# Patient Record
Sex: Female | Born: 1970 | Race: White | Hispanic: Yes | Marital: Single | Smoking: Never smoker
Health system: Southern US, Community
[De-identification: ages and names within clinical notes are randomized; demographics above are authoritative.]

## PROBLEM LIST (undated history)

## (undated) DIAGNOSIS — J45909 Unspecified asthma, uncomplicated: Secondary | ICD-10-CM

## (undated) DIAGNOSIS — N809 Endometriosis, unspecified: Secondary | ICD-10-CM

## (undated) HISTORY — DX: Unspecified asthma, uncomplicated: J45.909

## (undated) HISTORY — PX: HYSTEROSCOPY: SHX211

## (undated) HISTORY — PX: DILATION AND CURETTAGE OF UTERUS: SHX78

## (undated) HISTORY — PX: KNEE SURGERY: SHX244

## (undated) HISTORY — DX: Endometriosis, unspecified: N80.9

## (undated) HISTORY — PX: VAGINAL HYSTERECTOMY: SUR661

---

## 2006-07-20 ENCOUNTER — Other Ambulatory Visit: Admission: RE | Admit: 2006-07-20 | Discharge: 2006-07-20 | Payer: Self-pay | Admitting: Obstetrics and Gynecology

## 2006-09-20 ENCOUNTER — Encounter: Admission: RE | Admit: 2006-09-20 | Discharge: 2006-09-20 | Payer: Self-pay | Admitting: Obstetrics and Gynecology

## 2007-01-21 ENCOUNTER — Encounter: Payer: Self-pay | Admitting: Obstetrics and Gynecology

## 2007-01-21 ENCOUNTER — Ambulatory Visit (HOSPITAL_COMMUNITY): Admission: RE | Admit: 2007-01-21 | Discharge: 2007-01-22 | Payer: Self-pay | Admitting: Obstetrics and Gynecology

## 2007-05-07 ENCOUNTER — Emergency Department (HOSPITAL_COMMUNITY): Admission: EM | Admit: 2007-05-07 | Discharge: 2007-05-07 | Payer: Self-pay | Admitting: Emergency Medicine

## 2007-07-24 ENCOUNTER — Other Ambulatory Visit: Admission: RE | Admit: 2007-07-24 | Discharge: 2007-07-24 | Payer: Self-pay | Admitting: Obstetrics and Gynecology

## 2007-08-02 ENCOUNTER — Emergency Department (HOSPITAL_COMMUNITY): Admission: EM | Admit: 2007-08-02 | Discharge: 2007-08-02 | Payer: Self-pay | Admitting: Emergency Medicine

## 2008-01-16 ENCOUNTER — Ambulatory Visit (HOSPITAL_COMMUNITY): Admission: RE | Admit: 2008-01-16 | Discharge: 2008-01-16 | Payer: Self-pay | Admitting: Obstetrics and Gynecology

## 2009-05-19 ENCOUNTER — Encounter: Admission: RE | Admit: 2009-05-19 | Discharge: 2009-05-19 | Payer: Self-pay | Admitting: Internal Medicine

## 2009-07-15 ENCOUNTER — Ambulatory Visit: Payer: Self-pay | Admitting: Obstetrics and Gynecology

## 2009-07-15 ENCOUNTER — Other Ambulatory Visit: Admission: RE | Admit: 2009-07-15 | Discharge: 2009-07-15 | Payer: Self-pay | Admitting: Obstetrics and Gynecology

## 2009-07-20 ENCOUNTER — Ambulatory Visit (HOSPITAL_COMMUNITY): Admission: RE | Admit: 2009-07-20 | Discharge: 2009-07-20 | Payer: Self-pay | Admitting: Obstetrics and Gynecology

## 2009-08-16 DIAGNOSIS — R599 Enlarged lymph nodes, unspecified: Secondary | ICD-10-CM | POA: Insufficient documentation

## 2009-09-15 ENCOUNTER — Encounter (INDEPENDENT_AMBULATORY_CARE_PROVIDER_SITE_OTHER): Payer: Self-pay | Admitting: *Deleted

## 2009-09-15 DIAGNOSIS — R5381 Other malaise: Secondary | ICD-10-CM

## 2009-09-15 DIAGNOSIS — Z90710 Acquired absence of both cervix and uterus: Secondary | ICD-10-CM | POA: Insufficient documentation

## 2009-09-15 DIAGNOSIS — Z87898 Personal history of other specified conditions: Secondary | ICD-10-CM | POA: Insufficient documentation

## 2009-09-15 DIAGNOSIS — J45909 Unspecified asthma, uncomplicated: Secondary | ICD-10-CM | POA: Insufficient documentation

## 2009-09-15 DIAGNOSIS — R5383 Other fatigue: Secondary | ICD-10-CM

## 2009-09-15 DIAGNOSIS — M542 Cervicalgia: Secondary | ICD-10-CM | POA: Insufficient documentation

## 2009-09-27 ENCOUNTER — Ambulatory Visit: Payer: Self-pay | Admitting: Infectious Diseases

## 2009-09-27 LAB — CONVERTED CEMR LAB

## 2009-09-28 ENCOUNTER — Ambulatory Visit (HOSPITAL_COMMUNITY): Admission: RE | Admit: 2009-09-28 | Discharge: 2009-09-28 | Payer: Self-pay | Admitting: Infectious Diseases

## 2009-10-05 ENCOUNTER — Telehealth: Payer: Self-pay | Admitting: Infectious Diseases

## 2010-04-06 ENCOUNTER — Encounter: Admission: RE | Admit: 2010-04-06 | Discharge: 2010-04-06 | Payer: Self-pay | Admitting: Otolaryngology

## 2010-06-07 NOTE — Miscellaneous (Signed)
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

## 2010-06-07 NOTE — Miscellaneous (Signed)
Summary: HIPAA Restrictions  HIPAA Restrictions   Imported By: Florinda Marker 09/28/2009 14:12:58  _____________________________________________________________________  External Attachment:    Type:   Image     Comment:   External Document

## 2010-06-07 NOTE — Progress Notes (Signed)
Summary: needs ent referral  Phone Note Outgoing Call   Summary of Call: spoke with pt and discussed the mild LAN on her CT scan.  She is still having pain. I told her we shoudl refer to ENT for bxp and evaluation to r/o unusual infection or malignancy Initial call taken by: Clydie Braun MD,  Oct 05, 2009 9:18 AM

## 2010-06-07 NOTE — Assessment & Plan Note (Signed)
Summary: new pt/lymphadenopathy/kdw   CC:  new patient / lymphadenopathy.  History of Present Illness: 40 yo hispanic female here with lump in neck. Began Nov 201 with tingling in spine and ticklish feeling.  Then pain began.  Went to [facility] and got a shot of ?abx.  Saw Dr Thea Silversmith in Jan 2011.  At that time she was having constant pain not mproving at all.  Had referral to Neuro Dr Richardean Chimera and had extensive work up including 2 mris.  No obvisou etiolog. Pain is constant, but varies by day.  Feels like needles in neck.  Goes down L arm at times. No cough, no dental issues.  No sinus issues.  No ear infection.  She has had constant pain and occas swelling in the region. No fevers chils, NS.  No wt loss (has gained about 12 pounds)  Was on FMLA for work at [employer] for a month  No travel outside country. no pets.  No history of TB exposure.  Has had TB test done in past andthey have been negative  Preventive Screening-Counseling & Management  Alcohol-Tobacco     Alcohol drinks/day: 0     Smoking Status: never  Caffeine-Diet-Exercise     Caffeine use/day: coffee     Does Patient Exercise: no  Safety-Violence-Falls     Seat Belt Use: yes   Current Allergies (reviewed today): ! IBUPROFEN Past History:  Family History: Last updated: 09/27/2009 no fh LAN or lymphoma throat cancer in mat gf aunt with ovarian.  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 From [place] - only in [place] for 4 yrs. No travel Lived for 10 years in [place] [place]    Risk Factors: Alcohol Use: 0 (09/27/2009) Caffeine Use: coffee (09/27/2009) Exercise: no (09/27/2009)  Risk Factors: Smoking Status: never (09/27/2009)  Past Medical History: uterine cancer - Saw Dr Kerrie Pleasure this march and no evidence recurrent cancer.  astham migraines  Family History: no fh LAN or lymphoma throat cancer in mat  gf aunt with ovarian.  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 From [place] - only in [place] for 4 yrs. No travel Lived for 10 years in [place] [place]    Review of Systems       11 systems reviewed and negative except per HPI   Vital Signs:  Patient profile:   40 year old female Height:      62 inches (157.48 cm) Weight:      162.4 pounds (73.82 kg) BMI:     29.81 Temp:     98.0 degrees F (36.67 degrees C) oral Pulse rate:   69 / minute BP sitting:   133 / 88  (right arm)  Vitals Entered By: Baxter Hire) (Sep 27, 2009 3:05 PM) CC: new patient / lymphadenopathy Pain Assessment Patient in pain? yes     Location: neck Intensity: 4 Type: aching Onset of pain  Constant Nutritional Status BMI of 25 - 29 = overweight Nutritional Status Detail appetite is normal per patient  Does patient need assistance? Functional Status Self care Ambulation Normal   Physical Exam  General:  alert and well-developed.   Head:  normocephalic.   Eyes:  vision grossly intact, pupils equal, pupils round, and pupils reactive to light.   Ears:  R ear normal and L ear normal.   Mouth:  fair dentition.   Neck:  somewhat limited side to side movement due to pain.  + tenderness over L trapezius. some shotty L ant cerv lan and ? L [place] small node Lungs:  clear Heart:  normal rate and regular rhythm.   Abdomen:  soft, non-tender, and normal bowel sounds.   Msk:  normal ROM.   Extremities:  no cce Neurologic:  alert & oriented X3 and cranial nerves II-XII intact.   Skin:  acne on face but ow clear Cervical Nodes:  shotty L ant cerv and L [place] node?? Psych:  Oriented X3 and memory intact for recent and remote.     Impression & Recommendations:  Problem # 1:  LYMPHADENOPATHY (ICD-785.6) 40 yo with 6 months of L neck pain and ? mild LAN.  No fevers or wt loss.   Has had extensive w/u by Neuroology without etiology for  her pain.    Difff dx - cat scratch disease, tb, lymphoma, toxo, kikuchi. WIll check PPD, HIV, RPR and CT neck.   Will base further work up on CT neck findings.  May need serological work up but not sure this is infecitous. Consider referral to ENT for  with 1/2sample sent to path and half for routine afb and fungal cx.   Orders: Consultation Level IV (04540) T-HIV Antibody  (Reflex) [phone]) T-RPR (Syphilis) [phone]) TB Skin Test [phone]) CT with & without Contrast (CT w&w/o Contrast)  Problem # 2:  FATIGUE (ICD-780.79) see above Orders: Consultation Level IV (62952) TB Skin Test (84132) CT with & without Contrast (CT w&w/o Contrast)  Medications Added to Medication List This Visit: 1)  Singulair 10 Mg Tabs (Montelukast sodium) .... Take as needed. 2)  Hydroxyzine Hcl 25 Mg Tabs (Hydroxyzine hcl) .... Take one tablet by mouth once a day. 3)  Methocarbamol 500 Mg Tabs (Methocarbamol) .... Take one tablet by mouth at night.  Other Orders: Admin 1st Vaccine (44010)  Patient Instructions: 1)  Get CT of neck and TB Test.  Return Wed or Thurs to have TB test read.   2)  Call by friday for results.   Immunizations Administered:  PPD Skin Test:    Vaccine Type: PPD    Site: left forearm    Mfr: Sanofi Pasteur    Dose: 0.1 ml    Route: ID    Given by: Wendall Mola CMA ( AAMA)    Exp. Date: 09/10/2011    Lot #: [id number]  PPD Results    Date of reading: 09/29/2009    Results: < 5mm    Interpretation: negative

## 2010-08-04 ENCOUNTER — Emergency Department (HOSPITAL_COMMUNITY): Payer: BC Managed Care – PPO

## 2010-08-04 ENCOUNTER — Emergency Department (HOSPITAL_COMMUNITY)
Admission: EM | Admit: 2010-08-04 | Discharge: 2010-08-04 | Disposition: A | Discharge status: Home / Self Care | Payer: BC Managed Care – PPO | Attending: Emergency Medicine | Admitting: Emergency Medicine

## 2010-08-04 DIAGNOSIS — M542 Cervicalgia: Secondary | ICD-10-CM | POA: Insufficient documentation

## 2010-08-04 DIAGNOSIS — M25519 Pain in unspecified shoulder: Secondary | ICD-10-CM | POA: Insufficient documentation

## 2010-08-04 DIAGNOSIS — R51 Headache: Secondary | ICD-10-CM | POA: Insufficient documentation

## 2010-08-04 DIAGNOSIS — IMO0002 Reserved for concepts with insufficient information to code with codable children: Secondary | ICD-10-CM | POA: Insufficient documentation

## 2010-08-04 DIAGNOSIS — S139XXA Sprain of joints and ligaments of unspecified parts of neck, initial encounter: Secondary | ICD-10-CM | POA: Insufficient documentation

## 2010-08-04 DIAGNOSIS — J45909 Unspecified asthma, uncomplicated: Secondary | ICD-10-CM | POA: Insufficient documentation

## 2010-08-09 ENCOUNTER — Other Ambulatory Visit (HOSPITAL_COMMUNITY)
Admission: RE | Admit: 2010-08-09 | Discharge: 2010-08-09 | Disposition: A | Discharge status: Home / Self Care | Payer: BC Managed Care – PPO | Source: Ambulatory Visit | Attending: Obstetrics and Gynecology | Admitting: Obstetrics and Gynecology

## 2010-08-09 ENCOUNTER — Other Ambulatory Visit: Payer: Self-pay | Admitting: Obstetrics and Gynecology

## 2010-08-09 ENCOUNTER — Encounter (INDEPENDENT_AMBULATORY_CARE_PROVIDER_SITE_OTHER): Payer: BC Managed Care – PPO | Admitting: Obstetrics and Gynecology

## 2010-08-09 DIAGNOSIS — Z124 Encounter for screening for malignant neoplasm of cervix: Secondary | ICD-10-CM | POA: Insufficient documentation

## 2010-08-09 DIAGNOSIS — Z01419 Encounter for gynecological examination (general) (routine) without abnormal findings: Secondary | ICD-10-CM

## 2010-08-09 DIAGNOSIS — Z13220 Encounter for screening for lipoid disorders: Secondary | ICD-10-CM

## 2010-08-09 DIAGNOSIS — Z833 Family history of diabetes mellitus: Secondary | ICD-10-CM

## 2010-09-16 ENCOUNTER — Other Ambulatory Visit: Payer: Self-pay | Admitting: Obstetrics and Gynecology

## 2010-09-16 DIAGNOSIS — N644 Mastodynia: Secondary | ICD-10-CM

## 2010-09-20 NOTE — Op Note (Signed)
NAME:  Rachel Reyes, Rachel Reyes              ACCOUNT NO.:  [id number]   MEDICAL RECORD NO.:  [id number]          PATIENT TYPE:  AMB   LOCATION:  SDC                           [facility]   PHYSICIAN:  Daniel L. Gottsegen, M.D.DATE OF BIRTH:  [birth year]   DATE OF PROCEDURE:  01/21/2007  DATE OF DISCHARGE:                               OPERATIVE REPORT   PREOPERATIVE DIAGNOSIS:  1. Complex endometrial hyperplasia.  2. Pelvic pain.  3. Endometriosis suspected.   POSTOPERATIVE DIAGNOSIS:  1. Complex endometrial hyperplasia.  2. Pelvic pain.   OPERATION:  Laparoscopic-assisted vaginal hysterectomy.   SURGEON:  Daniel L. Eda Paschal, M.D.   FIRST ASSISTANT:  Juan H. Lily Peer, M.D.   FINDINGS AT SURGERY:  At the time of surgery the patient's uterus was  slightly boggy and enlarged.  Right ovary and fallopian tube were  normal.  Left ovary appeared to be slightly enlarged on ultrasound  within a month it had looked normal.  It was aspirated with a spinal  needle, and no fluid was obtained.  Pelvic peritoneum was free of any  disease.   DESCRIPTION OF PROCEDURE:  After adequate general orotracheal anesthesia  the patient was placed in the dorsal supine position, and prepped and  draped in the usual sterile manner.  A Foley catheter was inserted into  the patient's bladder.  A Hulka catheter was inserted in the patient's  uterus.  A subumbilical midline incision was made and using the Excel  and direct vision, a direct entry into the peritoneal cavity was done  atraumatically.  Two 5 mm ports were placed in the right and left lower  quadrant incisions.   The uterus was then elevated.  The round ligaments were bipolared and  cut.  The vesicouterine fold of peritoneum was sharply incised.  The  utero-ovarian and fallopian tubes were bipolared and cut in such a  fashion leaving the ovaries and tubes intact.  The uterine arteries were  bipolared and cut.   Attention was next turned  vaginally.  A 1:200,000 solution of  epinephrine was injected around the cervix and 1/2% Xylocaine.  A 360-  degree incision was made around the cervix.  The bladder was mobilized  superiorly as was the posterior peritoneum.  Posterior peritoneum and  vesicouterine fold of peritoneum were entered by sharp dissection.  The  uterosacral ligaments were clamped, in clamping them they were  shortened, and then they were sutured to the vault laterally for good  vault support.   Cardinal ligaments and the balance of the broad ligament were then  clamped, cut, and suture ligated as well.  A #1 chromic catgut was  utilized for all the above-mentioned pedicles.  The uterus was  delivered, sent to pathology for tissue diagnosis.  There was just a  suggestion that the left ovary might be enlarged.  It felt normal.  There was no evidence of any firm lesion there.  An aspiration was done,  and no real fluid was obtained; and, as noted above, the patient had a  normal ultrasound a month before.   At this point the cuff  was sutured to the peritoneum with two running #0  Vicryls.  A 3-0 Vicryl modified McCall's enterocele was placed to  prevent enterocele, and then the cuff and peritoneum were closed with  figure-of-eights and #1 chromic catgut.  Copious irrigation have been  done with sterile saline.  Two sponge, needle, and instrument counts  were correct.   The surgeons regloved, went back above, inspected the pelvis, and there  were several slight oozings that were controlled with bipolar and then  all cul-de-sac fluid was removed.  The trocars were removed.  The  subumbilical fascial incision was closed with #0 Vicryl and all three  skin incisions were closed with Dermabond.  Estimated blood loss for the  entire procedure was less than 100 mL with none replaced.  The patient  tolerated the procedure well and left the operating room in satisfactory  condition, draining clear urine from her Foley  catheter.      Daniel L. Eda Paschal, M.D.  Electronically Signed     DLG/MEDQ  D:  01/21/2007  T:  01/21/2007  Job:  [id number]

## 2010-09-20 NOTE — H&P (Signed)
NAME:  Rachel Reyes, Rachel Reyes              ACCOUNT NO.:  [id number]   MEDICAL RECORD NO.:  [id number]         PATIENT TYPE:  WOIB   LOCATION:                                [facility]   PHYSICIAN:  Daniel L. Eda Paschal, M.D.   DATE OF BIRTH:   DATE OF ADMISSION:  DATE OF DISCHARGE:                              HISTORY & PHYSICAL   It was marked priority because she is for the operating room this  morning.   CHIEF COMPLAINT:  Complex hyperplasia plus pelvic pain with  endometriosis strongly suspected.   HISTORY OF PRESENT ILLNESS:  The patient is a 40 year old nulligravida  who has been followed both in [place], [place] and now in  [place], with recurrent complex endometrial hyperplasia.  She has  been treated initially with norethindrone.  Norethindrone did reverse  the changes of the endometrial hyperplasia, however, the patient had  hair growth and weight gain with it, and elected to stop it.  She was  switched to oral contraceptives because of this, after a biopsy had  shown the complex hyperplasia which had recurred twice was not now  present.  The patient has not done well with pills.  She breaks through.  She finds them expensive on her health insurance in spite of using  different types, and she also continues to have pelvic pain.  She had  been told previously that she had endometriosis, although this was a  clinical diagnosis without laparoscopic confirmation.  The patient had  been previously told to have a hysterectomy because of the atypia of her  complex hyperplasia, but had elected not to do so because at that time  she wanted to preserve her uterus for childbearing.  Her initial  diagnosis had been made in 2004.  She now has decided that she does not  want to proceed with pregnancy, and would like to stop taking medication  to protect her lining against the endometrial hyperplasia.  She,  therefore, enters the hospital for laparoscopic-assisted vaginal  hysterectomy.  Because of her age, we have elected to save her ovaries;  however, if she has significant endometriosis, she has given me  permission to do what is appropriate based on the findings at time of  surgery.  The additional issue is that because she has never had any  children, I have explained to her that it may not be possible to do the  surgery laparoscopically or vaginally and, therefore, she may have to  proceed with an incision, but we will make that determination on the day  of surgery.   PAST MEDICAL HISTORY:  The patient has had 3 D&Cs/hysteroscopies, all of  which were for endometrial hyperplasia.  She gets migraines.  She also  has asthma.   CURRENT MEDICATIONS:  Oral contraceptives, hydroxyzine,  hydrochlorothiazide, Singulair and Imitrex.   She is ALLERGIC TO IBUPROFEN.   FAMILY HISTORY:  Grandmother is diabetic.  Maternal aunt had ovarian  cancer.   SOCIAL HISTORY:  She is a nonsmoker/nondrinker.   REVIEW OF SYSTEMS:  HEENT:  Negative except for her history of  migraines.  CARDIOVASCULAR:  Negative.  RESPIRATORY:  A history of  asthma.  GI:  Negative.  GU:  Negative.  NEUROMUSCULAR:  Negative.  ENDOCRINE:  Negative.   PHYSICAL EXAMINATION:  The patient is a well-developed and well-  nourished female in no acute distress.  Her blood pressure is 120/74.  Her pulse is 80 and regular.  Respirations are 16, nonlabored.  She is afebrile.  HEENT:  All within normal limits.  NECK:  Supple.  Trach in the midline.  Thyroid is not enlarged.  LUNGS:  Clear to P&A.  HEART:  No thrills, heaves or murmurs.  BREASTS:  No masses.  ABDOMEN:  Soft without guarding, rebound or masses.  PELVIC:  External is normal.  BUS is normal.  Vaginal is normal.  Cervix  is clean.  Pap smear shows no atypia.  Uterus is enlarged by exceedingly  small myoma, and she has top-normal size.  Adnexa fails to reveal  masses.  RECTOVAGINAL:  Confirmatory.  EXTREMITIES:  Within normal  limits.   ADMISSION IMPRESSION:  Recurrent endometrial hyperplasia with resistance  to medical treatment.  Pelvic pain with concern that she has  endometriosis.   PLAN:  Laparoscopic-assisted vaginal hysterectomy.      Daniel L. Eda Paschal, M.D.  Electronically Signed     DLG/MEDQ  D:  01/21/2007  T:  01/21/2007  Job:  [id number]

## 2010-09-22 ENCOUNTER — Ambulatory Visit
Admission: RE | Admit: 2010-09-22 | Discharge: 2010-09-22 | Disposition: A | Discharge status: Home / Self Care | Payer: BC Managed Care – PPO | Source: Ambulatory Visit | Attending: Obstetrics and Gynecology | Admitting: Obstetrics and Gynecology

## 2010-09-22 DIAGNOSIS — N644 Mastodynia: Secondary | ICD-10-CM

## 2010-09-23 NOTE — Discharge Summary (Signed)
NAME:  Rachel Reyes, PINEAU              ACCOUNT NO.:  [id number]   MEDICAL RECORD NO.:  [id number]          PATIENT TYPE:  OIB   LOCATION:  9311                          [facility]   PHYSICIAN:  Daniel L. Gottsegen, M.D.DATE OF BIRTH:  May 30, 1970   DATE OF ADMISSION:  01/21/2007  DATE OF DISCHARGE:  01/22/2007                               DISCHARGE SUMMARY   HISTORY OF PRESENT ILLNESS:  The patient is a 40 year old female who has  a long history of complex hyperplasia with atypia who has been treated  conservatively for years but finally had requested to have hysterectomy  for definitive treatment as she was tired of continuing with oral  progestins.  She has also been having pelvic pain and endometriosis was  suspected.  As a result of the above, she was taken to the operating  room on September 15 and a laparoscopic-assisted vaginal hysterectomy  was performed.  Postoperatively, she did well and was ready for  discharge on the next morning.  She was discharged on Tylox for pain  relief.  She was discharged with regular activity.  She will be seen in  our office for follow-up in 3 weeks.  Final pathology report revealed  simple hyperplasia without atypia.  This was in spite of continuing on  treatment and no evidence of endometriosis.   LABORATORY DATA:  Showed a normal CBC and urinalysis.   CONDITION ON DISCHARGE:  Improved.   DISCHARGE DIAGNOSIS:  Complex hyperplasia with atypia status post years  of medical treatment, pelvic pain.   OPERATION:  Laparoscopic-assisted vaginal hysterectomy.   CONDITION ON DISCHARGE:  Improved.      Daniel L. Eda Paschal, M.D.  Electronically Signed     DLG/MEDQ  D:  02/19/2007  T:  02/19/2007  Job:  [id number]

## 2011-01-30 LAB — POCT I-STAT, CHEM 8
BUN: 12
Calcium, Ion: 1.13
Chloride: 104
Creatinine, Ser: 0.7
Glucose, Bld: 98
HCT: 38
Hemoglobin: 12.9
Potassium: 3.9
Sodium: 138
TCO2: 26

## 2011-01-30 LAB — POCT CARDIAC MARKERS
CKMB, poc: <1 — ABNORMAL LOW
Myoglobin, poc: 31.8
Operator id: 294501
Troponin i, poc: <0.05

## 2011-02-17 LAB — CBC
MCHC: 35.3
MCV: 92.3
Platelets: 329
RBC: 4.06
RDW: 12.8

## 2011-02-17 LAB — URINALYSIS, ROUTINE W REFLEX MICROSCOPIC
Bilirubin Urine: NEGATIVE
Ketones, ur: NEGATIVE
Nitrite: NEGATIVE
Protein, ur: NEGATIVE
pH: 6.5

## 2011-02-17 LAB — HCG, SERUM, QUALITATIVE: Preg, Serum: NEGATIVE

## 2011-10-20 ENCOUNTER — Other Ambulatory Visit: Payer: Self-pay | Admitting: Neurology

## 2011-10-20 DIAGNOSIS — M542 Cervicalgia: Secondary | ICD-10-CM

## 2011-10-25 ENCOUNTER — Other Ambulatory Visit: Payer: BC Managed Care – PPO

## 2012-05-31 ENCOUNTER — Encounter: Payer: Self-pay | Admitting: Gynecology

## 2012-05-31 DIAGNOSIS — N809 Endometriosis, unspecified: Secondary | ICD-10-CM | POA: Insufficient documentation

## 2012-05-31 DIAGNOSIS — J45909 Unspecified asthma, uncomplicated: Secondary | ICD-10-CM | POA: Insufficient documentation

## 2012-05-31 DIAGNOSIS — N8500 Endometrial hyperplasia, unspecified: Secondary | ICD-10-CM | POA: Insufficient documentation

## 2012-06-10 ENCOUNTER — Other Ambulatory Visit: Payer: Self-pay | Admitting: *Deleted

## 2012-06-10 ENCOUNTER — Encounter: Payer: Self-pay | Admitting: Gynecology

## 2012-06-10 ENCOUNTER — Ambulatory Visit (INDEPENDENT_AMBULATORY_CARE_PROVIDER_SITE_OTHER): Payer: BC Managed Care – PPO | Admitting: Gynecology

## 2012-06-10 ENCOUNTER — Other Ambulatory Visit (HOSPITAL_COMMUNITY)
Admission: RE | Admit: 2012-06-10 | Discharge: 2012-06-10 | Disposition: A | Discharge status: Home / Self Care | Payer: BC Managed Care – PPO | Source: Ambulatory Visit | Attending: Gynecology | Admitting: Gynecology

## 2012-06-10 VITALS — BP 122/74 | Ht 62.0 in | Wt 170.0 lb

## 2012-06-10 DIAGNOSIS — N644 Mastodynia: Secondary | ICD-10-CM

## 2012-06-10 DIAGNOSIS — Z01419 Encounter for gynecological examination (general) (routine) without abnormal findings: Secondary | ICD-10-CM

## 2012-06-10 LAB — PROLACTIN: Prolactin: 13.3 ng/mL

## 2012-06-10 NOTE — Patient Instructions (Signed)
Follow up for ultrasound and mammogram as arranged by office. Follow up for annual gynecologic exam in one year.

## 2012-06-10 NOTE — Progress Notes (Signed)
Rachel Reyes Oct 12, 1970 [id number]        42 y.o.  G0P0 for annual exam.  Several issues noted below.  Past medical history,surgical history, medications, allergies, family history and social history were all reviewed and documented in the EPIC chart. ROS:  Was performed and pertinent positives and negatives are included in the history.  Exam: Kim assistant Filed Vitals:   06/10/12 1145  BP: 122/74  Height: 5\' 2"  (1.575 m)  Weight: 170 lb (77.111 kg)   General appearance  Normal Skin grossly normal Head/Neck normal with no cervical or supraclavicular adenopathy thyroid normal Lungs  clear Cardiac RR, without RMG Abdominal  soft, nontender, without masses, organomegaly or hernia Breasts  examined lying and sitting without masses, retractions, discharge or axillary adenopathy. Pelvic  Ext/BUS/vagina  normal    Adnexa  Without masses or tenderness    Anus and perineum  normal   Rectovaginal  normal sphincter tone without palpated masses or tenderness.    Assessment/Plan:  42 y.o. G0P0 female for annual exam.   1. Left breast mastalgia. Present for several months. History of this previously which seemed to get better but now returned. No galactorrhea or other symptoms.  Exam is normal. We'll check baseline prolactin, diagnostic mammography and ultrasound. Assuming negative strategies to include heat/nonsteroidal anti-inflammatories/short course low dose oral contraceptives/danazol/testosterone reviewed. 2. History LAVH for complex endometrial hyperplasia with atypia 09/2010. Overall doing well we'll continue to monitor. 3. Pap smear 2012. No history of abnormal Pap smears previously. Options to stop screening altogether versus less frequent screening reviewed. Pap of cuff done today as she is new to me. We'll readdress screening on an annual basis. 4. Health maintenance. No blood work other than prolactin done today as it is all done through her primary physician's office who she sees on  an annual basis. Follow up for studies per above otherwise annually    Dara Lords MD, 12:17 PM 06/10/2012

## 2012-06-10 NOTE — Addendum Note (Signed)
Addended by: Dayna Barker on: 06/10/2012 12:26 PM   Modules accepted: Orders

## 2012-06-11 ENCOUNTER — Ambulatory Visit
Admission: RE | Admit: 2012-06-11 | Discharge: 2012-06-11 | Disposition: A | Discharge status: Home / Self Care | Payer: BC Managed Care – PPO | Source: Ambulatory Visit | Attending: Gynecology | Admitting: Gynecology

## 2012-06-11 DIAGNOSIS — N644 Mastodynia: Secondary | ICD-10-CM

## 2012-06-11 LAB — URINALYSIS W MICROSCOPIC + REFLEX CULTURE
Bacteria, UA: NONE SEEN
Bilirubin Urine: NEGATIVE
Casts: NONE SEEN
Crystals: NONE SEEN
Hgb urine dipstick: NEGATIVE
Ketones, ur: NEGATIVE mg/dL
Specific Gravity, Urine: 1.011 (ref 1.005–1.030)
Urobilinogen, UA: 0.2 mg/dL (ref 0.0–1.0)

## 2012-06-12 ENCOUNTER — Other Ambulatory Visit: Payer: Self-pay | Admitting: Gynecology

## 2012-06-12 MED ORDER — FLUCONAZOLE 150 MG PO TABS: 150.0000 mg | ORAL_TABLET | Freq: Once | ORAL | Status: DC
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
## 2013-06-09 ENCOUNTER — Telehealth: Payer: Self-pay

## 2013-06-09 ENCOUNTER — Other Ambulatory Visit: Payer: Self-pay

## 2013-06-09 DIAGNOSIS — Z1231 Encounter for screening mammogram for malignant neoplasm of breast: Secondary | ICD-10-CM

## 2013-06-09 NOTE — Telephone Encounter (Signed)
Patient advised.

## 2013-06-09 NOTE — Telephone Encounter (Signed)
Patient called stating she needed to schedule her diagnostic mammogram.  I called her back to understand her need. She said Dr. Reece AgarG always scheduled her diagnostic because she has very dense breasts that make mammogram difficult to view and also she said high risk for cancer because she had hysterectomy for ut cancer.  She has CE with you 07/10/13. (Of note, you ordered diagnostic last year because she came in with some breast tenderness.)  Please advise.

## 2013-06-09 NOTE — Telephone Encounter (Signed)
I would recommend 3-D mammography

## 2013-07-10 ENCOUNTER — Ambulatory Visit
Admission: RE | Admit: 2013-07-10 | Discharge: 2013-07-10 | Disposition: A | Discharge status: Home / Self Care | Payer: BC Managed Care – PPO | Source: Ambulatory Visit

## 2013-07-10 ENCOUNTER — Encounter: Payer: Self-pay | Admitting: Gynecology

## 2013-07-10 ENCOUNTER — Ambulatory Visit (INDEPENDENT_AMBULATORY_CARE_PROVIDER_SITE_OTHER): Payer: BC Managed Care – PPO

## 2013-07-10 ENCOUNTER — Other Ambulatory Visit: Payer: Self-pay | Admitting: Gynecology

## 2013-07-10 ENCOUNTER — Ambulatory Visit (INDEPENDENT_AMBULATORY_CARE_PROVIDER_SITE_OTHER): Payer: BC Managed Care – PPO | Admitting: Gynecology

## 2013-07-10 VITALS — BP 120/68 | Ht 62.0 in | Wt 167.0 lb

## 2013-07-10 DIAGNOSIS — R1031 Right lower quadrant pain: Secondary | ICD-10-CM

## 2013-07-10 DIAGNOSIS — R109 Unspecified abdominal pain: Secondary | ICD-10-CM

## 2013-07-10 DIAGNOSIS — R102 Pelvic and perineal pain: Secondary | ICD-10-CM

## 2013-07-10 DIAGNOSIS — N949 Unspecified condition associated with female genital organs and menstrual cycle: Secondary | ICD-10-CM

## 2013-07-10 DIAGNOSIS — Z01419 Encounter for gynecological examination (general) (routine) without abnormal findings: Secondary | ICD-10-CM

## 2013-07-10 DIAGNOSIS — N831 Corpus luteum cyst of ovary, unspecified side: Secondary | ICD-10-CM

## 2013-07-10 DIAGNOSIS — N898 Other specified noninflammatory disorders of vagina: Secondary | ICD-10-CM

## 2013-07-10 DIAGNOSIS — N951 Menopausal and female climacteric states: Secondary | ICD-10-CM

## 2013-07-10 DIAGNOSIS — Z1231 Encounter for screening mammogram for malignant neoplasm of breast: Secondary | ICD-10-CM

## 2013-07-10 LAB — WET PREP FOR TRICH, YEAST, CLUE
Clue Cells Wet Prep HPF POC: NONE SEEN
Trich, Wet Prep: NONE SEEN
WBC, Wet Prep HPF POC: NONE SEEN

## 2013-07-10 MED ORDER — FLUCONAZOLE 150 MG PO TABS: 150.0000 mg | ORAL_TABLET | Freq: Once | ORAL | Status: AC

## 2013-07-10 NOTE — Patient Instructions (Signed)
Followup for ultrasound in 3 months to relook at the right ovarian cyst.  Office will contact you with your lab results.  If your abdominal pain continues then I recommend following up with a gastroenterologist to investigate if your intestines are not causing the pain.  You may obtain a copy of any labs that were done today by logging onto MyChart as outlined in the instructions provided with your AVS (after visit summary). The office will not call with normal lab results but certainly if there are any significant abnormalities then we will contact you.   Health Maintenance, Female A healthy lifestyle and preventative care can promote health and wellness.  Maintain regular health, dental, and eye exams.  Eat a healthy diet. Foods like vegetables, fruits, whole grains, low-fat dairy products, and lean protein foods contain the nutrients you need without too many calories. Decrease your intake of foods high in solid fats, added sugars, and salt. Get information about a proper diet from your caregiver, if necessary.  Regular physical exercise is one of the most important things you can do for your health. Most adults should get at least 150 minutes of moderate-intensity exercise (any activity that increases your heart rate and causes you to sweat) each week. In addition, most adults need muscle-strengthening exercises on 2 or more days a week.   Maintain a healthy weight. The body mass index (BMI) is a screening tool to identify possible weight problems. It provides an estimate of body fat based on height and weight. Your caregiver can help determine your BMI, and can help you achieve or maintain a healthy weight. For adults 20 years and older:  A BMI below 18.5 is considered underweight.  A BMI of 18.5 to 24.9 is normal.  A BMI of 25 to 29.9 is considered overweight.  A BMI of 30 and above is considered obese.  Maintain normal blood lipids and cholesterol by exercising and minimizing your  intake of saturated fat. Eat a balanced diet with plenty of fruits and vegetables. Blood tests for lipids and cholesterol should begin at age 26 and be repeated every 5 years. If your lipid or cholesterol levels are high, you are over 50, or you are a high risk for heart disease, you may need your cholesterol levels checked more frequently.Ongoing high lipid and cholesterol levels should be treated with medicines if diet and exercise are not effective.  If you smoke, find out from your caregiver how to quit. If you do not use tobacco, do not start.  Lung cancer screening is recommended for adults aged 62 80 years who are at high risk for developing lung cancer because of a history of smoking. Yearly low-dose computed tomography (CT) is recommended for people who have at least a 30-pack-year history of smoking and are a current smoker or have quit within the past 15 years. A pack year of smoking is smoking an average of 1 pack of cigarettes a day for 1 year (for example: 1 pack a day for 30 years or 2 packs a day for 15 years). Yearly screening should continue until the smoker has stopped smoking for at least 15 years. Yearly screening should also be stopped for people who develop a health problem that would prevent them from having lung cancer treatment.  If you are pregnant, do not drink alcohol. If you are breastfeeding, be very cautious about drinking alcohol. If you are not pregnant and choose to drink alcohol, do not exceed 1 drink per day. One  drink is considered to be 12 ounces (355 mL) of beer, 5 ounces (148 mL) of wine, or 1.5 ounces (44 mL) of liquor.  Avoid use of street drugs. Do not share needles with anyone. Ask for help if you need support or instructions about stopping the use of drugs.  High blood pressure causes heart disease and increases the risk of stroke. Blood pressure should be checked at least every 1 to 2 years. Ongoing high blood pressure should be treated with medicines, if  weight loss and exercise are not effective.  If you are 55 to 45 years old, ask your caregiver if you should take aspirin to prevent strokes.  Diabetes screening involves taking a blood sample to check your fasting blood sugar level. This should be done once every 3 years, after age 45, if you are within normal weight and without risk factors for diabetes. Testing should be considered at a younger age or be carried out more frequently if you are overweight and have at least 1 risk factor for diabetes.  Breast cancer screening is essential preventative care for women. You should practice "breast self-awareness." This means understanding the normal appearance and feel of your breasts and may include breast self-examination. Any changes detected, no matter how small, should be reported to a caregiver. Women in their 20s and 30s should have a clinical breast exam (CBE) by a caregiver as part of a regular health exam every 1 to 3 years. After age 40, women should have a CBE every year. Starting at age 40, women should consider having a mammogram (breast X-ray) every year. Women who have a family history of breast cancer should talk to their caregiver about genetic screening. Women at a high risk of breast cancer should talk to their caregiver about having an MRI and a mammogram every year.  Breast cancer gene (BRCA)-related cancer risk assessment is recommended for women who have family members with BRCA-related cancers. BRCA-related cancers include breast, ovarian, tubal, and peritoneal cancers. Having family members with these cancers may be associated with an increased risk for harmful changes (mutations) in the breast cancer genes BRCA1 and BRCA2. Results of the assessment will determine the need for genetic counseling and BRCA1 and BRCA2 testing.  The Pap test is a screening test for cervical cancer. Women should have a Pap test starting at age 21. Between ages 21 and 29, Pap tests should be repeated every  2 years. Beginning at age 30, you should have a Pap test every 3 years as long as the past 3 Pap tests have been normal. If you had a hysterectomy for a problem that was not cancer or a condition that could lead to cancer, then you no longer need Pap tests. If you are between ages 65 and 70, and you have had normal Pap tests going back 10 years, you no longer need Pap tests. If you have had past treatment for cervical cancer or a condition that could lead to cancer, you need Pap tests and screening for cancer for at least 20 years after your treatment. If Pap tests have been discontinued, risk factors (such as a new sexual partner) need to be reassessed to determine if screening should be resumed. Some women have medical problems that increase the chance of getting cervical cancer. In these cases, your caregiver may recommend more frequent screening and Pap tests.  The human papillomavirus (HPV) test is an additional test that may be used for cervical cancer screening. The HPV test looks   for the virus that can cause the cell changes on the cervix. The cells collected during the Pap test can be tested for HPV. The HPV test could be used to screen women aged 5 years and older, and should be used in women of any age who have unclear Pap test results. After the age of 29, women should have HPV testing at the same frequency as a Pap test.  Colorectal cancer can be detected and often prevented. Most routine colorectal cancer screening begins at the age of 23 and continues through age 79. However, your caregiver may recommend screening at an earlier age if you have risk factors for colon cancer. On a yearly basis, your caregiver may provide home test kits to check for hidden blood in the stool. Use of a small camera at the end of a tube, to directly examine the colon (sigmoidoscopy or colonoscopy), can detect the earliest forms of colorectal cancer. Talk to your caregiver about this at age 46, when routine screening  begins. Direct examination of the colon should be repeated every 5 to 10 years through age 57, unless early forms of pre-cancerous polyps or small growths are found.  Hepatitis C blood testing is recommended for all people born from [birth year] through 1967 and any individual with known risks for hepatitis C.  Practice safe sex. Use condoms and avoid high-risk sexual practices to reduce the spread of sexually transmitted infections (STIs). Sexually active women aged 9 and younger should be checked for Chlamydia, which is a common sexually transmitted infection. Older women with new or multiple partners should also be tested for Chlamydia. Testing for other STIs is recommended if you are sexually active and at increased risk.  Osteoporosis is a disease in which the bones lose minerals and strength with aging. This can result in serious bone fractures. The risk of osteoporosis can be identified using a bone density scan. Women ages 31 and over and women at risk for fractures or osteoporosis should discuss screening with their caregivers. Ask your caregiver whether you should be taking a calcium supplement or vitamin D to reduce the rate of osteoporosis.  Menopause can be associated with physical symptoms and risks. Hormone replacement therapy is available to decrease symptoms and risks. You should talk to your caregiver about whether hormone replacement therapy is right for you.  Use sunscreen. Apply sunscreen liberally and repeatedly throughout the day. You should seek shade when your shadow is shorter than you. Protect yourself by wearing long sleeves, pants, a wide-brimmed hat, and sunglasses year round, whenever you are outdoors.  Notify your caregiver of new moles or changes in moles, especially if there is a change in shape or color. Also notify your caregiver if a mole is larger than the size of a pencil eraser.  Stay current with your immunizations. Document Released: 11/07/2010 Document Revised:  08/19/2012 Document Reviewed: 11/07/2010 [facility] Patient Information 2014 [place].
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
## 2013-07-11 LAB — URINALYSIS W MICROSCOPIC + REFLEX CULTURE
BILIRUBIN URINE: NEGATIVE
Bacteria, UA: NONE SEEN
CASTS: NONE SEEN
Crystals: NONE SEEN
GLUCOSE, UA: NEGATIVE mg/dL
Hgb urine dipstick: NEGATIVE
Ketones, ur: NEGATIVE mg/dL
LEUKOCYTES UA: NEGATIVE
Nitrite: NEGATIVE
PH: 5.5 (ref 5.0–8.0)
PROTEIN: NEGATIVE mg/dL
Specific Gravity, Urine: 1.028 (ref 1.005–1.030)
Urobilinogen, UA: 0.2 mg/dL (ref 0.0–1.0)

## 2013-07-11 LAB — TSH: TSH: 0.966 u[IU]/mL (ref 0.350–4.500)

## 2013-07-11 LAB — FOLLICLE STIMULATING HORMONE: FSH: 8.2 m[IU]/mL
